# Patient Record
Sex: Male | Born: 1995 | Hispanic: No
Health system: Southern US, Community
[De-identification: ages and names within clinical notes are randomized; demographics above are authoritative.]

## PROBLEM LIST (undated history)

## (undated) DIAGNOSIS — E119 Type 2 diabetes mellitus without complications: Secondary | ICD-10-CM

---
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
## 2019-10-23 ENCOUNTER — Ambulatory Visit: Payer: Self-pay | Attending: Internal Medicine
